# Patient Record
Sex: Female | Born: 1985 | Race: White | Hispanic: No | Marital: Married | Smoking: Never smoker
Health system: Southern US, Community
[De-identification: ages and names within clinical notes are randomized; demographics above are authoritative.]

## PROBLEM LIST (undated history)

## (undated) DIAGNOSIS — N946 Dysmenorrhea, unspecified: Secondary | ICD-10-CM

## (undated) DIAGNOSIS — G4700 Insomnia, unspecified: Secondary | ICD-10-CM

## (undated) DIAGNOSIS — F419 Anxiety disorder, unspecified: Secondary | ICD-10-CM

## (undated) DIAGNOSIS — R87619 Unspecified abnormal cytological findings in specimens from cervix uteri: Secondary | ICD-10-CM

## (undated) HISTORY — PX: COLPOSCOPY: SHX161

## (undated) HISTORY — DX: Unspecified abnormal cytological findings in specimens from cervix uteri: R87.619

## (undated) HISTORY — DX: Anxiety disorder, unspecified: F41.9

## (undated) HISTORY — PX: WRIST FRACTURE SURGERY: SHX121

## (undated) HISTORY — PX: TONSILLECTOMY: SUR1361

## (undated) HISTORY — DX: Insomnia, unspecified: G47.00

## (undated) HISTORY — DX: Dysmenorrhea, unspecified: N94.6

---

## 2008-07-14 ENCOUNTER — Inpatient Hospital Stay (HOSPITAL_COMMUNITY): Admission: AD | Admit: 2008-07-14 | Discharge: 2008-07-17 | Payer: Self-pay | Admitting: Obstetrics and Gynecology

## 2010-07-06 LAB — CBC
Hemoglobin: 12.5 g/dL (ref 12.0–15.0)
Platelets: 223 10*3/uL (ref 150–400)
RBC: 4.04 MIL/uL (ref 3.87–5.11)
RDW: 12.5 % (ref 11.5–15.5)
WBC: 11.8 10*3/uL — ABNORMAL HIGH (ref 4.0–10.5)
WBC: 13 10*3/uL — ABNORMAL HIGH (ref 4.0–10.5)

## 2010-07-06 LAB — RPR: RPR Ser Ql: NONREACTIVE

## 2014-10-07 ENCOUNTER — Telehealth: Payer: Self-pay | Admitting: Obstetrics and Gynecology

## 2014-10-07 NOTE — Telephone Encounter (Signed)
Called [facility] [place] and spoke with Osinohris. Thayer OhmChris will be sure the patient is aware she will need a colposcopy prior to our office calling her to schedule. Routing to [place] to precert while waiting to hear back from referring office.

## 2014-10-09 NOTE — Telephone Encounter (Signed)
Benefit verified. [facility] for scheduling with benefit information.

## 2014-10-09 NOTE — Telephone Encounter (Signed)
Tammy from [facility] called to inform our office the patient is now aware she will be having a colposcopy. Will call to schedule once precert done.

## 2014-10-09 NOTE — Telephone Encounter (Signed)
Called and left a message for patient to call back to schedule a new patient doctor referral. °

## 2014-10-09 NOTE — Telephone Encounter (Signed)
Called and spoke with Alcario DroughtErica at [facility] [place] requesting an update on whether the patient is aware she will be having a colposcopy at her visit with our office. She reported the patient has not been notified yet but will send a note to the doctor to be sure this gets done. They will call me back once the patient is aware.

## 2014-10-12 NOTE — Telephone Encounter (Signed)
Called and left a message for patient to call back to schedule a new patient doctor referral. °

## 2014-10-13 NOTE — Telephone Encounter (Signed)
Called and left a message for patient to call back to schedule a new patient doctor referral. °

## 2014-10-19 ENCOUNTER — Ambulatory Visit (INDEPENDENT_AMBULATORY_CARE_PROVIDER_SITE_OTHER): Payer: 59 | Admitting: Obstetrics and Gynecology

## 2014-10-19 ENCOUNTER — Encounter: Payer: Self-pay | Admitting: Obstetrics and Gynecology

## 2014-10-19 VITALS — BP 110/74 | HR 68 | Resp 16 | Ht 66.25 in | Wt 157.0 lb

## 2014-10-19 DIAGNOSIS — R888 Abnormal findings in other body fluids and substances: Secondary | ICD-10-CM

## 2014-10-19 DIAGNOSIS — Z01812 Encounter for preprocedural laboratory examination: Secondary | ICD-10-CM

## 2014-10-19 DIAGNOSIS — B977 Papillomavirus as the cause of diseases classified elsewhere: Secondary | ICD-10-CM

## 2014-10-19 DIAGNOSIS — R87610 Atypical squamous cells of undetermined significance on cytologic smear of cervix (ASC-US): Secondary | ICD-10-CM

## 2014-10-19 DIAGNOSIS — IMO0002 Reserved for concepts with insufficient information to code with codable children: Secondary | ICD-10-CM

## 2014-10-19 LAB — POCT URINE PREGNANCY: PREG TEST UR: NEGATIVE

## 2014-10-19 NOTE — Progress Notes (Signed)
Patient ID: Gina Bowman, female   DOB: 04/18/1985, 29 y.o.   MRN: [id number] The patient was sent for a consultation for an abnormal pap smear by Charlies Silvers, PA-C. Her pap from 09/22/14 returned with ASCUS/+HPV. Previously she had an abnormal pap about 3 years ago. She had a colposcopy, biopsy was okay. F/U paps were normal.  Sexually active, same partner x 2 years. She is on OCP's for cycle control. Partner with vasectomy.   She has one son, NSVD at 35 weeks.     Past Medical History  Diagnosis Date  . Abnormal Pap smear of cervix   . Anxiety   . Insomnia   . Dysmenorrhea    Past Surgical History  Procedure Laterality Date  . Colposcopy    . Tonsillectomy      O: Filed Vitals:   10/19/14 1457  BP: 110/74  Pulse: 68  Resp: 16   General: Alert white female in NAD Pelvic: normal external genitalia Colposcopy: unsatisfactory, no abnormalities noted on the cervix with application of acetic acid or lugols. Negative lugols examination of the vagina. ECC obtained  A/P ASCUS pap, +HPV Colposcopy unsatisfactory, no abnormalities seen, ECC obtained Reviewed abnormal pap's, hpv infection Discussed CIN  Discussed possible need for treatment, definite need for f/u Questions answered  CC: [employer], PA-C.

## 2014-10-23 ENCOUNTER — Telehealth: Payer: Self-pay | Admitting: *Deleted

## 2014-10-23 NOTE — Telephone Encounter (Signed)
Call to patient, left message to call back. 

## 2014-10-23 NOTE — Telephone Encounter (Signed)
-----   Message from Romualdo Bolk, MD sent at 10/22/2014  6:44 PM EDT ----- Can you please inform the patient that her ECC didn't have enough cells. She needs to return for another ECC (no colpo), just the cervical sampling. Thanks! ----- Message -----    From: Lorri Frederick, CMA    Sent: 10/19/2014   4:51 PM      To: Romualdo Bolk, MD

## 2014-10-27 NOTE — Telephone Encounter (Signed)
Spoke with patient. Advised of results as seen below from Dr.Jertson. Patient is agreeable. Appointment scheduled for 11/02/2014 at 4pm with Dr.Jertson. Patient is agreeable to date and time.  Routing to provider for final review. Patient agreeable to disposition. Will close encounter.   Patient aware provider will review message and nurse will return call if any additional advice or change of disposition.

## 2014-10-27 NOTE — Telephone Encounter (Signed)
Call to patient, left message to call back. Ask for triage nurse. 

## 2014-11-02 ENCOUNTER — Ambulatory Visit: Payer: 59 | Admitting: Obstetrics and Gynecology

## 2014-11-05 ENCOUNTER — Ambulatory Visit (INDEPENDENT_AMBULATORY_CARE_PROVIDER_SITE_OTHER): Payer: 59 | Admitting: Obstetrics and Gynecology

## 2014-11-05 ENCOUNTER — Encounter: Payer: Self-pay | Admitting: Obstetrics and Gynecology

## 2014-11-05 VITALS — BP 102/68 | HR 64 | Resp 14 | Wt 157.0 lb

## 2014-11-05 DIAGNOSIS — IMO0002 Reserved for concepts with insufficient information to code with codable children: Secondary | ICD-10-CM

## 2014-11-05 DIAGNOSIS — R896 Abnormal cytological findings in specimens from other organs, systems and tissues: Secondary | ICD-10-CM | POA: Diagnosis not present

## 2014-11-05 NOTE — Progress Notes (Signed)
Patient ID: Gina Bowman, female   DOB: 18-May-1985, 29 y.o.   MRN: [id number]  GYNECOLOGY  VISIT   HPI: 29 y.o.   Married  Caucasian  female   G1P0101 with Patient's last menstrual period was 10/30/2014.   here for   Repeat ECC, insufficient cells on ECC done at her colposcopy. No c/o.  GYNECOLOGIC HISTORY: Patient's last menstrual period was 10/30/2014. Contraception:OCP Menopausal hormone therapy: N/A Last mammogram: N/A Last pap smear: 09-22-14 ASCUS + HPV         OB History    Gravida Para Term Preterm AB TAB SAB Ectopic Multiple Living   1 1  1      1          There are no active problems to display for this patient.   Past Medical History  Diagnosis Date  . Abnormal Pap smear of cervix   . Anxiety   . Insomnia   . Dysmenorrhea     Past Surgical History  Procedure Laterality Date  . Colposcopy    . Tonsillectomy      Current Outpatient Prescriptions  Medication Sig Dispense Refill  . JUNEL 1.5/30 1.5-30 MG-MCG tablet Take 1 tablet by mouth daily.  11  . sertraline (ZOLOFT) 50 MG tablet Take 50 mg by mouth daily.  11   No current facility-administered medications for this visit.     ALLERGIES: Codeine and Latex  Family History  Problem Relation Age of Onset  . Migraines Mother   . Anxiety disorder Mother   . Alcohol abuse Father   . Stroke Maternal Grandmother   . Hypertension Maternal Grandfather   . Breast cancer Paternal Grandmother   . Heart disease Paternal Grandfather     Social History   Social History  . Marital Status: Married    Spouse Name: N/A  . Number of Children: N/A  . Years of Education: N/A   Occupational History  . Not on file.   Social History Main Topics  . Smoking status: Former Smoker -- 1.00 packs/day for 8 years    Types: Cigarettes  . Smokeless tobacco: Never Used  . Alcohol Use: 0.0 oz/week    0 Standard drinks or equivalent per week     Comment: socially   . Drug Use: No  . Sexual Activity:    Partners: Male    Other Topics Concern  . Not on file   Social History Narrative    ROS:  Pertinent items are noted in HPI.  PHYSICAL EXAMINATION:    BP 102/68 mmHg  Pulse 64  Resp 14  Wt 157 lb (71.215 kg)  LMP 10/30/2014    General appearance: alert, cooperative and appears stated age  Pelvic: External genitalia:  no lesions              Urethra:  normal appearing urethra with no masses, tenderness or lesions              Bartholins and Skenes: normal                 Vagina: normal appearing vagina with normal color and discharge, no lesions              Cervix: no lesions  ECC obtained   Chaperone was present for exam.  ASSESSMENT   ASCUS/HPV pap, insufficient cells on ECC done at the time of her colposcopy  PLAN  ECC Further plan based on results    An After Visit Summary was printed  and given to the patient.

## 2014-11-17 ENCOUNTER — Telehealth: Payer: Self-pay | Admitting: Emergency Medicine

## 2014-11-17 NOTE — Telephone Encounter (Signed)
-----   Message from Romualdo Bolk, MD sent at 11/16/2014 10:14 AM EDT ----- Please inform the patient with LSIL on her ECC, the recommendation is to have another pap and hpv in 1 year. If her pap returns with ASCUS, or +HPV, or worse, she will need another colposcopy. Please send a copy of the results and this note to [employer], PA.

## 2014-11-19 NOTE — Telephone Encounter (Signed)
Message left to return call to Kaiea Esselman at [phone].   08 Recall entered  

## 2014-11-23 NOTE — Telephone Encounter (Signed)
Message left to return call to Rayder Sullenger at [phone].    

## 2014-12-01 ENCOUNTER — Encounter: Payer: Self-pay | Admitting: Emergency Medicine

## 2014-12-01 NOTE — Telephone Encounter (Signed)
Patient returned call. She is given message from Dr. Oscar La.  She would like to follow up with PCP for repeat pap smear in one year. Discussed importance of keeping appointment for follow up. Patient verbalized understanding. 08 Recall in place. Patient declines to schedule follow up appointment at this time. Routing to provider for final review. Patient agreeable to disposition. Will close encounter.  Records cc to provider as per Dr. Oscar La.

## 2014-12-01 NOTE — Telephone Encounter (Signed)
Letter sent to home address to return call.

## 2014-12-01 NOTE — Telephone Encounter (Signed)
Message left to return call to Savva Beamer at [phone].    

## 2015-11-07 ENCOUNTER — Encounter (HOSPITAL_COMMUNITY): Payer: Self-pay | Admitting: *Deleted

## 2015-11-07 ENCOUNTER — Emergency Department (HOSPITAL_COMMUNITY)
Admission: EM | Admit: 2015-11-07 | Discharge: 2015-11-08 | Disposition: A | Discharge status: Home / Self Care | Payer: BLUE CROSS/BLUE SHIELD | Attending: Emergency Medicine | Admitting: Emergency Medicine

## 2015-11-07 ENCOUNTER — Emergency Department (HOSPITAL_COMMUNITY): Payer: BLUE CROSS/BLUE SHIELD

## 2015-11-07 DIAGNOSIS — Y92410 Unspecified street and highway as the place of occurrence of the external cause: Secondary | ICD-10-CM | POA: Diagnosis not present

## 2015-11-07 DIAGNOSIS — S42302A Unspecified fracture of shaft of humerus, left arm, initial encounter for closed fracture: Secondary | ICD-10-CM

## 2015-11-07 DIAGNOSIS — Y999 Unspecified external cause status: Secondary | ICD-10-CM | POA: Diagnosis not present

## 2015-11-07 DIAGNOSIS — S42332A Displaced oblique fracture of shaft of humerus, left arm, initial encounter for closed fracture: Secondary | ICD-10-CM | POA: Diagnosis not present

## 2015-11-07 DIAGNOSIS — S0081XA Abrasion of other part of head, initial encounter: Secondary | ICD-10-CM | POA: Insufficient documentation

## 2015-11-07 DIAGNOSIS — S4992XA Unspecified injury of left shoulder and upper arm, initial encounter: Secondary | ICD-10-CM | POA: Diagnosis present

## 2015-11-07 DIAGNOSIS — Y939 Activity, unspecified: Secondary | ICD-10-CM | POA: Diagnosis not present

## 2015-11-07 MED ORDER — ONDANSETRON HCL 4 MG/2ML IJ SOLN
INTRAMUSCULAR | Status: AC
  Filled 2015-11-07: qty 2

## 2015-11-07 MED ORDER — HYDROCODONE-ACETAMINOPHEN 5-325 MG PO TABS: 1.0000 | ORAL_TABLET | Freq: Four times a day (QID) | ORAL | 0 refills | Status: AC | PRN

## 2015-11-07 MED ORDER — SODIUM CHLORIDE 0.9 % IV BOLUS (SEPSIS)
1000.0000 mL | Freq: Once | INTRAVENOUS | Status: AC
  Administered 2015-11-07: 1000 mL via INTRAVENOUS

## 2015-11-07 MED ORDER — ONDANSETRON HCL 4 MG/2ML IJ SOLN
4.0000 mg | Freq: Once | INTRAMUSCULAR | Status: AC
  Administered 2015-11-07: 4 mg via INTRAVENOUS

## 2015-11-07 MED ORDER — HYDROMORPHONE HCL 1 MG/ML IJ SOLN
1.0000 mg | Freq: Once | INTRAMUSCULAR | Status: AC
  Administered 2015-11-07: 1 mg via INTRAVENOUS

## 2015-11-07 MED ORDER — HYDROMORPHONE HCL 1 MG/ML IJ SOLN
1.0000 mg | Freq: Once | INTRAMUSCULAR | Status: AC
  Administered 2015-11-07: 1 mg via INTRAVENOUS
  Filled 2015-11-07: qty 1

## 2015-11-07 MED ORDER — HYDROMORPHONE HCL 1 MG/ML IJ SOLN
INTRAMUSCULAR | Status: AC
  Filled 2015-11-07: qty 1

## 2015-11-07 MED ORDER — ONDANSETRON 4 MG PO TBDP
4.0000 mg | ORAL_TABLET | Freq: Once | ORAL | Status: AC
  Administered 2015-11-08: 4 mg via ORAL
  Filled 2015-11-07: qty 1

## 2015-11-07 NOTE — Progress Notes (Signed)
Orthopedic Tech Progress Note Patient Details:  Gina Bowman 12/09/1985 [id number]  Ortho Devices Type of Ortho Device: Sling immobilizer, Coapt Ortho Device/Splint Location: lue Ortho Device/Splint Interventions: Ordered, Application   Trinna PostMartinez, Cliffton Spradley J 11/07/2015, 11:26 PM

## 2015-11-07 NOTE — Discharge Instructions (Signed)
It was our pleasure to provide your ER care today - we hope that you feel better.  For comfort and swelling, elevated upper body/head, even while sleeping.   Ice/coldpack to sore area.   Keep splint clean/dry. Use sling.  Take motrin or aleve as need for pain. You may also take hydrocodone as need for pain. No driving when taking hydrocodone. Also, do not take tylenol or acetaminophen containing medication when taking hydrocodone.  Follow up with orthopedic doctor this Tuesday - see referral - call office tomorrow morning to arrange appointment.  Return to ER if worse, new symptoms, severe or intractable pain, numbness/weakness, new symptoms, other concern.   You were given pain medication in the ER - no driving for the next 8 hours.
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
## 2015-11-08 ENCOUNTER — Emergency Department (HOSPITAL_COMMUNITY): Payer: BLUE CROSS/BLUE SHIELD

## 2015-11-08 ENCOUNTER — Encounter: Payer: Self-pay | Admitting: Obstetrics and Gynecology

## 2015-11-08 NOTE — ED Notes (Signed)
Attempting to discharge pt, pt requests more time. Charge rn made aware.

## 2015-11-08 NOTE — Progress Notes (Signed)
Orthopedic Tech Progress Note Patient Details:  Gina Bowman 08/18/1985 [id number]  Ortho Devices Type of Ortho Device: Arm sling, Sugartong splint Ortho Device/Splint Location: rue Ortho Device/Splint Interventions: Ordered, Application   Trinna PostMartinez, Exodus Kutzer J 11/08/2015, 1:03 AM
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
